# Patient Record
Sex: Female | Born: 2000 | Race: White | Hispanic: No | Marital: Single | Smoking: Never smoker
Health system: Southern US, Community
[De-identification: ages and names within clinical notes are randomized; demographics above are authoritative.]

## PROBLEM LIST (undated history)

## (undated) DIAGNOSIS — M419 Scoliosis, unspecified: Secondary | ICD-10-CM

## (undated) DIAGNOSIS — G43909 Migraine, unspecified, not intractable, without status migrainosus: Secondary | ICD-10-CM

---
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
## 2020-04-15 ENCOUNTER — Other Ambulatory Visit: Payer: Self-pay

## 2020-04-15 ENCOUNTER — Emergency Department (HOSPITAL_BASED_OUTPATIENT_CLINIC_OR_DEPARTMENT_OTHER): Payer: Medicaid Other

## 2020-04-15 ENCOUNTER — Encounter (HOSPITAL_BASED_OUTPATIENT_CLINIC_OR_DEPARTMENT_OTHER): Payer: Self-pay | Admitting: Emergency Medicine

## 2020-04-15 ENCOUNTER — Emergency Department (HOSPITAL_BASED_OUTPATIENT_CLINIC_OR_DEPARTMENT_OTHER)
Admission: EM | Admit: 2020-04-15 | Discharge: 2020-04-15 | Disposition: A | Discharge status: Home / Self Care | Payer: Medicaid Other | Attending: Emergency Medicine | Admitting: Emergency Medicine

## 2020-04-15 DIAGNOSIS — J069 Acute upper respiratory infection, unspecified: Secondary | ICD-10-CM | POA: Insufficient documentation

## 2020-04-15 DIAGNOSIS — R509 Fever, unspecified: Secondary | ICD-10-CM

## 2020-04-15 DIAGNOSIS — G43809 Other migraine, not intractable, without status migrainosus: Secondary | ICD-10-CM | POA: Diagnosis not present

## 2020-04-15 DIAGNOSIS — R5383 Other fatigue: Secondary | ICD-10-CM | POA: Insufficient documentation

## 2020-04-15 LAB — COMPREHENSIVE METABOLIC PANEL
ALT: 12 U/L (ref 0–44)
AST: 17 U/L (ref 15–41)
Albumin: 3.9 g/dL (ref 3.5–5.0)
Alkaline Phosphatase: 43 U/L (ref 38–126)
Anion gap: 10 (ref 5–15)
BUN: 8 mg/dL (ref 6–20)
CO2: 24 mmol/L (ref 22–32)
Calcium: 8.6 mg/dL — ABNORMAL LOW (ref 8.9–10.3)
Chloride: 103 mmol/L (ref 98–111)
Creatinine, Ser: 0.57 mg/dL (ref 0.44–1.00)
GFR calc Af Amer: >60 mL/min (ref >60)
GFR calc non Af Amer: >60 mL/min (ref >60)
Glucose, Bld: 92 mg/dL (ref 70–99)
Potassium: 3.2 mmol/L — ABNORMAL LOW (ref 3.5–5.1)
Sodium: 137 mmol/L (ref 135–145)
Total Bilirubin: 0.3 mg/dL (ref 0.3–1.2)
Total Protein: 7.2 g/dL (ref 6.5–8.1)

## 2020-04-15 LAB — CBC WITH DIFFERENTIAL/PLATELET
Abs Immature Granulocytes: 0.03 10*3/uL (ref 0.00–0.07)
Basophils Absolute: 0 10*3/uL (ref 0.0–0.1)
Basophils Relative: 0 %
Eosinophils Absolute: 0 10*3/uL (ref 0.0–0.5)
Eosinophils Relative: 1 %
HCT: 37.4 % (ref 36.0–46.0)
Hemoglobin: 12.4 g/dL (ref 12.0–15.0)
Immature Granulocytes: 0 %
Lymphocytes Relative: 14 %
Lymphs Abs: 1.1 10*3/uL (ref 0.7–4.0)
MCH: 30 pg (ref 26.0–34.0)
MCHC: 33.2 g/dL (ref 30.0–36.0)
MCV: 90.3 fL (ref 80.0–100.0)
Monocytes Absolute: 0.6 10*3/uL (ref 0.1–1.0)
Monocytes Relative: 7 %
Neutro Abs: 6.1 10*3/uL (ref 1.7–7.7)
Neutrophils Relative %: 78 %
Platelets: 256 10*3/uL (ref 150–400)
RBC: 4.14 MIL/uL (ref 3.87–5.11)
RDW: 13.6 % (ref 11.5–15.5)
WBC: 7.9 10*3/uL (ref 4.0–10.5)
nRBC: 0 % (ref 0.0–0.2)

## 2020-04-15 LAB — TSH: TSH: 0.378 u[IU]/mL (ref 0.350–4.500)

## 2020-04-15 LAB — URINALYSIS, ROUTINE W REFLEX MICROSCOPIC
Bilirubin Urine: NEGATIVE
Glucose, UA: NEGATIVE mg/dL
Hgb urine dipstick: NEGATIVE
Ketones, ur: NEGATIVE mg/dL
Leukocytes,Ua: NEGATIVE
Nitrite: NEGATIVE
Protein, ur: NEGATIVE mg/dL
Specific Gravity, Urine: >1.03 — ABNORMAL HIGH (ref 1.005–1.030)
pH: 6 (ref 5.0–8.0)

## 2020-04-15 LAB — GROUP A STREP BY PCR: Group A Strep by PCR: NOT DETECTED

## 2020-04-15 LAB — PREGNANCY, URINE: Preg Test, Ur: NEGATIVE

## 2020-04-15 LAB — LIPASE, BLOOD: Lipase: 29 U/L (ref 11–51)

## 2020-04-15 MED ORDER — PROCHLORPERAZINE EDISYLATE 10 MG/2ML IJ SOLN
10.0000 mg | Freq: Once | INTRAMUSCULAR | Status: AC
  Administered 2020-04-15: 13:00:00 10 mg via INTRAVENOUS
  Filled 2020-04-15: qty 2

## 2020-04-15 MED ORDER — SODIUM CHLORIDE 0.9 % IV BOLUS
1000.0000 mL | Freq: Once | INTRAVENOUS | Status: AC
  Administered 2020-04-15: 12:00:00 1000 mL via INTRAVENOUS

## 2020-04-15 MED ORDER — DIPHENHYDRAMINE HCL 50 MG/ML IJ SOLN
25.0000 mg | Freq: Once | INTRAMUSCULAR | Status: AC
  Administered 2020-04-15: 13:00:00 25 mg via INTRAVENOUS
  Filled 2020-04-15: qty 1

## 2020-04-15 NOTE — ED Provider Notes (Signed)
[facility] EMERGENCY DEPARTMENT Provider Note   CSN: [id number] Arrival date & time: 04/15/20  1023     History Chief Complaint  Patient presents with  . Head and neck pain    Brandy Avila is a 19 y.o. female.  The history is provided by the patient, medical records and a parent. No language interpreter was used.  Migraine This is a recurrent problem. The current episode started yesterday. The problem occurs constantly. The problem has not changed since onset.Pertinent negatives include no chest pain, no abdominal pain, no headaches and no shortness of breath. Nothing (loud noises, light) aggravates the symptoms. Nothing relieves the symptoms. She has tried acetaminophen for the symptoms. The treatment provided no relief.       Past Medical History:  Diagnosis Date  . Migraine   . Scoliosis     There are no problems to display for this patient.   History reviewed. No pertinent surgical history.   OB History   No obstetric history on file.     No family history on file.  Social History   Tobacco Use  . Smoking status: Never Smoker  . Smokeless tobacco: Never Used  Substance Use Topics  . Alcohol use: No  . Drug use: No    Home Medications Prior to Admission medications   Medication Sig Start Date End Date Taking? Authorizing Provider  naproxen (NAPROSYN) 375 MG tablet Take 1 tablet twice daily as needed for chest wall pain. 10/05/17   Molpus, Jonny Ruiz, MD    Allergies    Patient has no known allergies.  Review of Systems   Review of Systems  Constitutional: Positive for chills, fatigue and fever. Negative for diaphoresis.  HENT: Positive for congestion, rhinorrhea and sore throat. Negative for sinus pain, trouble swallowing and voice change.   Eyes: Positive for photophobia. Negative for visual disturbance.  Respiratory: Positive for cough. Negative for chest tightness, shortness of breath, wheezing and stridor.   Cardiovascular: Negative for  chest pain, palpitations and leg swelling.  Gastrointestinal: Negative for abdominal pain, constipation, diarrhea, nausea and vomiting.  Genitourinary: Negative for dysuria, flank pain and frequency.  Musculoskeletal: Negative for back pain, neck pain and neck stiffness.  Neurological: Negative for dizziness, weakness, light-headedness, numbness and headaches.  Psychiatric/Behavioral: Negative for agitation.  All other systems reviewed and are negative.   Physical Exam Updated Vital Signs BP 128/86 (BP Location: Right Arm)   Pulse (!) 112   Temp 98.2 F (36.8 C) (Oral)   Resp 16   Ht 5' (1.524 m)   Wt 43.4 kg   LMP 04/09/2020   SpO2 98%   BMI 18.69 kg/m   Physical Exam Vitals and nursing note reviewed.  Constitutional:      General: She is not in acute distress.    Appearance: She is well-developed. She is not ill-appearing, toxic-appearing or diaphoretic.  HENT:     Head: Normocephalic and atraumatic.     Nose: Congestion and rhinorrhea present.     Mouth/Throat:     Mouth: Mucous membranes are dry.     Pharynx: No oropharyngeal exudate or posterior oropharyngeal erythema.  Eyes:     Extraocular Movements: Extraocular movements intact.     Conjunctiva/sclera: Conjunctivae normal.     Pupils: Pupils are equal, round, and reactive to light.  Cardiovascular:     Rate and Rhythm: Normal rate and regular rhythm.     Pulses: Normal pulses.     Heart sounds: No murmur heard.  Pulmonary:     Effort: Pulmonary effort is normal. No respiratory distress.     Breath sounds: Normal breath sounds. No wheezing, rhonchi or rales.  Chest:     Chest wall: No tenderness.  Abdominal:     General: Abdomen is flat.     Palpations: Abdomen is soft.     Tenderness: There is no abdominal tenderness. There is no right CVA tenderness, left CVA tenderness, guarding or rebound.  Musculoskeletal:        General: No tenderness or signs of injury.     Cervical back: Neck supple. No  tenderness.     Right lower leg: No edema.     Left lower leg: No edema.  Skin:    General: Skin is warm and dry.     Capillary Refill: Capillary refill takes less than 2 seconds.     Findings: No erythema.  Neurological:     General: No focal deficit present.     Mental Status: She is alert and oriented to person, place, and time.     Cranial Nerves: No cranial nerve deficit.     Sensory: No sensory deficit.     Motor: No weakness.     Coordination: Coordination normal.     Gait: Gait normal.  Psychiatric:        Mood and Affect: Mood normal.     ED Results / Procedures / Treatments   Labs (all labs ordered are listed, but only abnormal results are displayed) Labs Reviewed  URINALYSIS, ROUTINE W REFLEX MICROSCOPIC - Abnormal; Notable for the following components:      Result Value   Specific Gravity, Urine >1.030 (*)    All other components within normal limits  COMPREHENSIVE METABOLIC PANEL - Abnormal; Notable for the following components:   Potassium 3.2 (*)    Calcium 8.6 (*)    All other components within normal limits  GROUP A STREP BY PCR  URINE CULTURE  PREGNANCY, URINE  CBC WITH DIFFERENTIAL/PLATELET  LIPASE, BLOOD  TSH    EKG None  Radiology DG Chest Portable 1 View  Result Date: 04/15/2020 CLINICAL DATA:  Fever and fatigue. EXAM: PORTABLE CHEST 1 VIEW COMPARISON:  Chest x-ray dated October 05, 2017. FINDINGS: The heart size and mediastinal contours are within normal limits. Both lungs are clear. The visualized skeletal structures are unremarkable. IMPRESSION: No active disease. Electronically Signed   By: Obie DredgeWilliam T Derry M.D.   On: 04/15/2020 12:46    Procedures Procedures (including critical care time)  Medications Ordered in ED Medications  sodium chloride 0.9 % bolus 1,000 mL (0 mLs Intravenous Stopped 04/15/20 1308)  prochlorperazine (COMPAZINE) injection 10 mg (10 mg Intravenous Given 04/15/20 1303)  diphenhydrAMINE (BENADRYL) injection 25 mg (25  mg Intravenous Given 04/15/20 1301)    ED Course  I have reviewed the triage vital signs and the nursing notes.  Pertinent labs & imaging results that were available during my care of the patient were reviewed by me and considered in my medical decision making (see chart for details).    MDM Rules/Calculators/A&P                          Carleene MainsMegan Capri is a 19 y.o. female with a past medical history significant for scoliosis and severe migraines who presents with several days of chills, mild cough, malaise, fatigue, congestion, and worsened headache for the last 2 days.  She reports he is also had some sore throat  and some mild neck soreness.  She reports that she called her PCP today and although her symptoms are improving, they told her to come to the emergency department to get worked up and to rule out meningitis.  Patient reports that her headache was 11 out of 10 yesterday but is now 6 out of 10.  She reports it is similar to prior migraines but slightly worse.  She reports no current vision changes.  She reports of nausea but no vomiting.  She denies any urinary or GI symptoms otherwise.  She does report some dry cough that is nonproductive.  She reports phonophobia and photophobia consistent with prior migraines.  She reports no neck stiffness and is moving her neck normally.  She has no rashes or known tick being embedded in her.  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  She does report that the rest of her found members have all had URI symptoms and colds recently but have all improved.  She does report some mild sore throat and has not had strep throat in some time she reports.  She is currently on her menstrual cycle and denies any pelvic symptoms.  On arrival, patient is slightly tachycardic but is afebrile.  On my exam, she had no focal neurologic deficits with normal sensation and strength in all extremities.  Symmetric smile.  Clear speech.  No pupil  irregularities with normal extraocular movements.  Oropharyngeal exam did not show significant erythema but there was some posterior pharyngeal drainage seen.  No neck tenderness and normal neck range of motion.  No evidence of rigidity whatsoever.  Patient well-appearing.  Lungs clear and chest nontender.  Abdomen nontender.  Had a long shared decision-making conversation with the patient and mother.  Clinical aspect she has a viral URI or strep with a that has been exacerbated with the high temperatures causing mild dehydration and causing a significant migraine.  Given her improving symptoms and lack of neurologic deficits or current neck pain/neck stiffness, we have a very low suspicion for meningitis at this time.  We agreed to try a headache cocktail and work-up for other infection with urine, chest x-ray, labs, and a strep swab.  If work-up is all reassuring and have symptoms improved, we will hold on lumbar puncture and family agrees.  Do not feel she needs CT head at this time.  Patient does report she is had fatigue for months and has never been worked up for an is concerned about potentially a thyroid problem.  We will send a TSH off but do not suspect he will return today.  Suspect PCP can follow-up on this.  We discussed possibility of RMSF given the fever yesterday and her working outside however without of tick exposure, rash, low suspicion for it at this time.  Anticipate reassessment after headache cocktail and work-up.     3:14 PM Patient reassessed and work-up is reassuring.  Pregnancy is negative.  Analysis shows increased gravity but otherwise no infection.  CBC and CMP shows only mild hypokalemia.  Patient encouraged to increase her potassium intake.  Strep test negative and lipase not elevated.  TSH is still in process and she will follow-up with PCP for this result for her chronic fatigue.  Chest x-ray shows no pneumonia and she reports her headache is now completely resolved.   Headache is 0 out of 10.  Given the improvement in her otherwise were reassuring work-up and labs, and the reassessment having  no headache, neck pain, neck stiffness, doubt meningitis.  I suspect a viral URI exacerbated and caused a migraine.  Patient family agree and they agree on holding on LP or other management.  Patient will be discharged home with instructions to follow-up PCP and stay hydrated.  Patient will stay out of work for the next few days given her recent fever and URI symptoms.  They did not want Covid testing but were offered.  Patient and family agree with plan of care and patient discharged in good condition with resolved symptoms.   Final Clinical Impression(s) / ED Diagnoses Final diagnoses:  Other migraine without status migrainosus, not intractable  Fever, unspecified fever cause  Upper respiratory tract infection, unspecified type  Fatigue, unspecified type    Rx / DC Orders ED Discharge Orders    None     Clinical Impression: 1. Other migraine without status migrainosus, not intractable   2. Fever, unspecified fever cause   3. Upper respiratory tract infection, unspecified type   4. Fatigue, unspecified type     Disposition: Discharge  Condition: Good  I have discussed the results, Dx and Tx plan with the pt(& family if present). He/she/they expressed understanding and agree(s) with the plan. Discharge instructions discussed at great length. Strict return precautions discussed and pt &/or family have verbalized understanding of the instructions. No further questions at time of discharge.    New Prescriptions   No medications on file    Follow Up: [facility]     [facility] [place] [facility] DEPART[facility] [address] [id number] DX IPJA [place] [address] [phone]       Edelmiro Innocent, Canary Brim, MD 04/15/20 1536

## 2020-04-15 NOTE — ED Triage Notes (Signed)
Pt had migraine and neck pain with fever and fatigue yesterday.  Called pmd today and was told to come to ED to r/o meningitis.

## 2020-04-15 NOTE — Discharge Instructions (Signed)
Your history and exam today are consistent with a likely viral URI exacerbating dehydration and a migraine type headache.  Based on your history and exam we have very low suspicion for meningitis as we discussed.  Your labs were all reassuring except for some slightly decreased potassium which we discussed.  Please follow-up with your PCP for the results of the thyroid test.  Given the resolution of your headache, we feel you are safe for discharge home without further management here.  Please rest and stay hydrated.  Please avoid work for the next few days given the recent fever and URI.  If any symptoms change or worsen, please return to the nearest emergency department.

## 2020-04-15 NOTE — ED Notes (Signed)
ED Provider at bedside. 

## 2020-04-16 LAB — URINE CULTURE

## 2021-12-03 IMAGING — DX DG CHEST 1V PORT
1 series · 1 of 1 positions shown · non-contrast
Comparison: Chest x-ray dated October 05, 2017.

CLINICAL DATA: Fever and fatigue.

EXAM:
PORTABLE CHEST 1 VIEW

[chest ap]
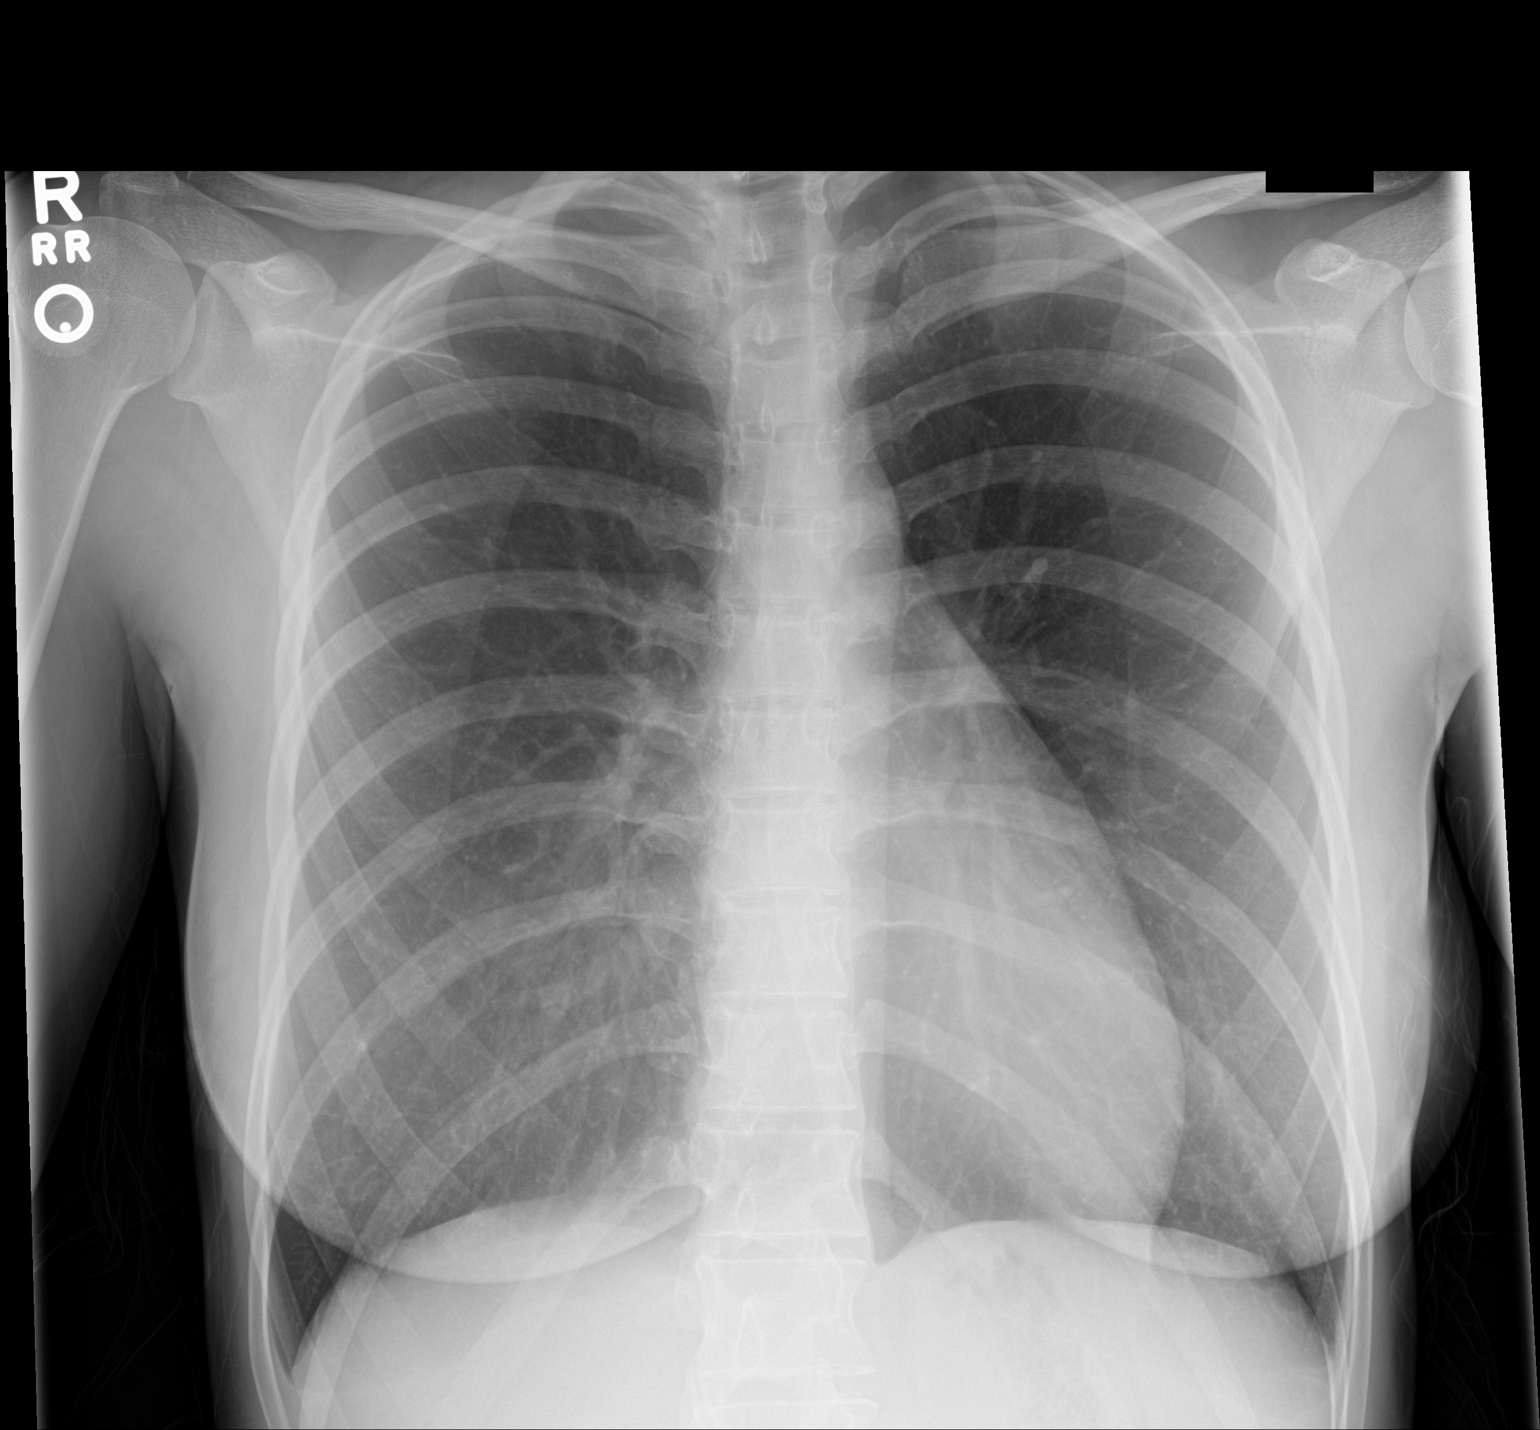

[1 of 1 positions shown; findings below may reference images not displayed]

FINDINGS: The heart size and mediastinal contours are within normal limits.
Both lungs are clear. The visualized skeletal structures are
unremarkable.
IMPRESSION: No active disease.
